# Patient Record
Sex: Male | Born: 1970 | Hispanic: Yes | Marital: Single | Smoking: Never smoker
Health system: Southern US, Community
[De-identification: ages and names within clinical notes are randomized; demographics above are authoritative.]

---

## 2013-12-22 ENCOUNTER — Emergency Department (HOSPITAL_COMMUNITY): Payer: BC Managed Care – PPO

## 2013-12-22 ENCOUNTER — Emergency Department (HOSPITAL_COMMUNITY)
Admission: EM | Admit: 2013-12-22 | Discharge: 2013-12-22 | Disposition: A | Discharge status: Home / Self Care | Payer: BC Managed Care – PPO | Attending: Emergency Medicine | Admitting: Emergency Medicine

## 2013-12-22 ENCOUNTER — Encounter (HOSPITAL_COMMUNITY): Payer: Self-pay | Admitting: Emergency Medicine

## 2013-12-22 DIAGNOSIS — S63289A Dislocation of proximal interphalangeal joint of unspecified finger, initial encounter: Secondary | ICD-10-CM

## 2013-12-22 DIAGNOSIS — W010XXA Fall on same level from slipping, tripping and stumbling without subsequent striking against object, initial encounter: Secondary | ICD-10-CM | POA: Insufficient documentation

## 2013-12-22 DIAGNOSIS — Z23 Encounter for immunization: Secondary | ICD-10-CM | POA: Insufficient documentation

## 2013-12-22 DIAGNOSIS — Y929 Unspecified place or not applicable: Secondary | ICD-10-CM | POA: Insufficient documentation

## 2013-12-22 DIAGNOSIS — S63279A Dislocation of unspecified interphalangeal joint of unspecified finger, initial encounter: Secondary | ICD-10-CM | POA: Insufficient documentation

## 2013-12-22 DIAGNOSIS — Y9389 Activity, other specified: Secondary | ICD-10-CM | POA: Insufficient documentation

## 2013-12-22 MED ORDER — TETANUS-DIPHTH-ACELL PERTUSSIS 5-2.5-18.5 LF-MCG/0.5 IM SUSP
0.5000 mL | Freq: Once | INTRAMUSCULAR | Status: AC
  Administered 2013-12-22: 0.5 mL via INTRAMUSCULAR
  Filled 2013-12-22: qty 0.5

## 2013-12-22 MED ORDER — IBUPROFEN 400 MG PO TABS
800.0000 mg | ORAL_TABLET | Freq: Once | ORAL | Status: AC
  Administered 2013-12-22: 800 mg via ORAL
  Filled 2013-12-22: qty 4

## 2013-12-22 NOTE — ED Notes (Signed)
Pt. presents with deformity at right index finger injured this evening , pt. fell  / no LOC /ambulatory.

## 2013-12-22 NOTE — ED Provider Notes (Signed)
CSN: [id number]     Arrival date & time 12/22/13  1947 History   None    This chart was scribed for non-physician practitioner, Coral Ceo, PA-C working with Gwyneth Sprout, MD by Arlan Organ, ED Scribe. This patient was seen in room TR09C/TR09C and the patient's care was started at 8:44 PM.   Chief Complaint  Patient presents with  . Finger Injury   The history is provided by the patient. No language interpreter was used.    HPI Comments: Eric Moore is a 43 y.o. male with no PMH who presents to the Emergency Department complaining of a R index finger injury that started just prior to arrival. Pt states he slipped and fell while in a [place] and feels he may have dislocated his finger. No head trauma or LOC. He now reports constant, moderate pain to R index finger that is unchanged. He has not tried anything OTC or any home remedies for his discomfort. At this time he denies an fever, chills, numbness, paresthesia, or loss of sensation. He has no other pertinent past medical history. No other concerns this visit. Has a small abrasion to the finger. Tetanus not up to date.    History reviewed. No pertinent past medical history. History reviewed. No pertinent past surgical history. No family history on file. History  Substance Use Topics  . Smoking status: Never Smoker   . Smokeless tobacco: Not on file  . Alcohol Use: No    Review of Systems  Gastrointestinal: Negative for nausea and vomiting.  Musculoskeletal: Positive for arthralgias (Deformity to R index finger) and joint swelling. Negative for back pain, gait problem, myalgias and neck pain.  Skin: Positive for wound. Negative for rash.  Neurological: Negative for weakness and numbness.    Allergies  Review of patient's allergies indicates no known allergies.  Home Medications   Prior to Admission medications   Not on File   Triage Vitals: BP 110/67  Pulse 67  Temp(Src) 98.5 F (36.9 C) (Oral)  Resp 14  Ht  5\' 9"  (1.753 m)  Wt 183 lb (83.008 kg)  BMI 27.01 kg/m2  SpO2 99%   Filed Vitals:   12/22/13 2022  BP: 110/67  Pulse: 67  Temp: 98.5 F (36.9 C)  TempSrc: Oral  Resp: 14  Height: 5\' 9"  (1.753 m)  Weight: 183 lb (83.008 kg)  SpO2: 99%    Physical Exam  Nursing note and vitals reviewed. Constitutional: He is oriented to person, place, and time. He appears well-developed and well-nourished. No distress.  HENT:  Head: Normocephalic and atraumatic.  Right Ear: External ear normal.  Left Ear: External ear normal.  Mouth/Throat: Oropharynx is clear and moist.  Eyes: Conjunctivae are normal. Right eye exhibits no discharge. Left eye exhibits no discharge.  Neck: Normal range of motion.  Cardiovascular: Normal rate.   Radial pulses present and equal bilaterally. Capillary refill <2 seconds in right 2nd digit  Pulmonary/Chest: Effort normal.  Abdominal: He exhibits no distension.  Musculoskeletal: Normal range of motion. He exhibits edema and tenderness.       Hands: Obvious deformity to the right 2nd digit at the PIP joint with surrounding edema. Middle and distal phalanx are displaced posteriorly at the PIP joint.Tenderness to palpation to the right 2nd digit throughout. No other tenderness to palpation to the right hand or wrist.   Neurological: He is alert and oriented to person, place, and time.  Sensation intact in the right 2nd digit  Skin: Skin is warm  and dry. He is not diaphoretic.  3 mm circular abrasion to the middle phalanx of the right 2nd digit. Bleeding controlled.   Psychiatric: He has a normal mood and affect.    ED Course  Reduction of dislocation Date/Time: 12/22/2013 9:00 PM Performed by: Coral CeoPALMER, Aries Kasa K Authorized by: Jillyn LedgerPALMER, Keyasha Miah K Consent: Verbal consent obtained. Risks and benefits: risks, benefits and alternatives were discussed Consent given by: patient Patient identity confirmed: verbally with patient Preparation: Patient was prepped and draped  in the usual sterile fashion. Local anesthesia used: yes Anesthesia: digital block Local anesthetic: lidocaine 2% without epinephrine Anesthetic total: 4 ml Patient sedated: no Patient tolerance: Patient tolerated the procedure well with no immediate complications. Comments: Patient able to flex and extend PIP, DIP and MCP joints of right 2nd digit post-reduction   (including critical care time)  DIAGNOSTIC STUDIES: Oxygen Saturation is 99% on RA, Normal by my interpretation.    COORDINATION OF CARE: 8:48 PM- Will give Boostrix and Ibuprofen. Will order Boostrix and DG Finger Index R. Discussed treatment plan with pt at bedside and pt agreed to plan.     Labs Review Labs Reviewed - No data to display  Imaging Review Dg Finger Index Right  12/22/2013   CLINICAL DATA:  Postreduction, right index finger PIP joint dislocation.  EXAM: RIGHT INDEX FINGER 2+V  COMPARISON:  12/22/2013 at 2123 hr  FINDINGS: PIP joint dislocation has been successfully reduced. No fracture. There is surrounding soft tissue swelling.  IMPRESSION: Successful reduction of the right index finger PIP joint dislocation.   Electronically Signed   By: Amie Portlandavid  Ormond M.D.   On: 12/22/2013 22:59   Dg Finger Index Right  12/22/2013   CLINICAL DATA:  Post fall, now with pain involving the index finger  EXAM: RIGHT INDEX FINGER 2+V  COMPARISON:  None  FINDINGS: The PIP joint of the index finger is dislocated with the base of the middle phalanx perched upon the dorsal lateral cortex of the distal epiphysis of the proximal phalanx. This finding is without associated definitive displaced fracture. Expected adjacent soft tissue swelling. No radiopaque foreign body.  IMPRESSION: Dislocated PIP joint of the index finger without associated fracture or radiopaque foreign body.   Electronically Signed   By: Simonne ComeJohn  Watts M.D.   On: 12/22/2013 21:36     EKG Interpretation None      MDM   Eric Moore is a 43 y.o. male with no PMH  who presents to the Emergency Department complaining of a R index finger injury that started just prior to arrival. Patient found to have a PIP dislocation, which was successfully reduced in the ED. No fx. Patient neurovascularly intact. Small abrasion, which did not require repair. Tetanus booster given in ED. Finger splinted. Hand referral. RICE method discussed. Return precautions, discharge instructions, and follow-up was discussed with the patient before discharge.     There are no discharge medications for this patient.    Final impressions: 1. Dislocation of finger PIP joint      Luiz IronJessica Katlin Sibley Rolison PA-C   This patient was discussed with Dr. Anitra LauthPlunkett    I personally performed the services described in this documentation, which was scribed in my presence. The recorded information has been reviewed and is accurate.    Jillyn LedgerJessica K Abella Shugart, PA-C 12/24/13 [phone]

## 2013-12-22 NOTE — Discharge Instructions (Signed)
Take Ibuprofen for pain  Keep hand elevated - see RICE method  Return to the emergency department if you develop any changing/worsening condition, or any other concerns (please read additional information regarding your condition below)    Finger Dislocation Finger dislocation is the displacement of bones in your finger at the joints. Most commonly, finger dislocation occurs at the proximal interphalangeal joint (the joint closest to your knuckle). Very strong, fibrous tissues (ligaments) and joint capsules connect the three bones of your fingers.  CAUSES Dislocation is caused by a forceful impact. This impact moves these bones off the joint and often tears your ligaments.  SYMPTOMS Symptoms of finger dislocation include:  Deformity of your finger.  Pain, with loss of movement. DIAGNOSIS  Finger dislocation is diagnosed with a physical exam. Often, X-ray exams are done to see if you have associated injuries, such as bone fractures. TREATMENT  Finger dislocations are treated by putting your bones back into position (reduction) either by manually moving the bones back into place or through surgery. Your finger is then kept in a fixed position (immobilized) with the use of a dressing or splint for a brief period. When your ligament has to be surgically repaired, it needs to be kept in a fixed position with a dressing or splint for 1 to 2 weeks. Because joint stiffness is a long-term complication of finger dislocation, hand exercises or physical therapy to increase the range of motion and to regain strength is usually started as soon as the ligament is healed. Exercises and therapy generally last no more than 3 months. HOME CARE INSTRUCTIONS The following measures can help to reduce pain and speed up the healing process:  Rest your injured joint. Do not move until instructed otherwise by your caregiver. Avoid activities similar to the one that caused your injury.  Apply ice to your injured  joint for the first day or 2 after your reduction or as directed by your caregiver. Applying ice helps to reduce inflammation and pain.  Put ice in a plastic bag.  Place a towel between your skin and the bag.  Leave the ice on for 15-20 minutes at a time, every 2 hours while you are awake.  Elevate your hand above your heart as directed by your caregiver to reduce swelling.  Take over-the-counter or prescription medicine for pain as your caregiver instructs you. SEEK IMMEDIATE MEDICAL CARE IF:  Your dressing or splint becomes damaged.  Your pain becomes worse rather than better.  You lose feeling in your finger, or it becomes cold and white. MAKE SURE YOU:  Understand these instructions.  Will watch your condition.  Will get help right away if you are not doing well or get worse. Document Released: 07/04/2000 Document Revised: 09/29/2011 Document Reviewed: 04/27/2011 [facility] Patient Information 2014 [place], [place].  RICE: Routine Care for Injuries The routine care of many injuries includes Rest, Ice, Compression, and Elevation (RICE). HOME CARE INSTRUCTIONS  Rest is needed to allow your body to heal. Routine activities can usually be resumed when comfortable. Injured tendons and bones can take up to 6 weeks to heal. Tendons are the cord-like structures that attach muscle to bone.  Ice following an injury helps keep the swelling down and reduces pain.  Put ice in a plastic bag.  Place a towel between your skin and the bag.  Leave the ice on for 15-20 minutes, 03-04 times a day. Do this while awake, for the first 24 to 48 hours. After that, continue as  directed by your caregiver.  Compression helps keep swelling down. It also gives support and helps with discomfort. If an elastic bandage has been applied, it should be removed and reapplied every 3 to 4 hours. It should not be applied tightly, but firmly enough to keep swelling down. Watch fingers or toes for swelling, bluish  discoloration, coldness, numbness, or excessive pain. If any of these problems occur, remove the bandage and reapply loosely. Contact your caregiver if these problems continue.  Elevation helps reduce swelling and decreases pain. With extremities, such as the arms, hands, legs, and feet, the injured area should be placed near or above the level of the heart, if possible. SEEK IMMEDIATE MEDICAL CARE IF:  You have persistent pain and swelling.  You develop redness, numbness, or unexpected weakness.  Your symptoms are getting worse rather than improving after several days. These symptoms may indicate that further evaluation or further X-rays are needed. Sometimes, X-rays may not show a small broken bone (fracture) until 1 week or 10 days later. Make a follow-up appointment with your caregiver. Ask when your X-ray results will be ready. Make sure you get your X-ray results. Document Released: 10/19/2000 Document Revised: 09/29/2011 Document Reviewed: 12/06/2010 [facility] Patient Information 2014 [place], [place].  Laceration Care, Adult A laceration is a cut or lesion that goes through all layers of the skin and into the tissue just beneath the skin. TREATMENT  Some lacerations may not require closure. Some lacerations may not be able to be closed due to an increased risk of infection. It is important to see your caregiver as soon as possible after an injury to minimize the risk of infection and maximize the opportunity for successful closure. If closure is appropriate, pain medicines may be given, if needed. The wound will be cleaned to help prevent infection. Your caregiver will use stitches (sutures), staples, wound glue (adhesive), or skin adhesive strips to repair the laceration. These tools bring the skin edges together to allow for faster healing and a better cosmetic outcome. However, all wounds will heal with a scar. Once the wound has healed, scarring can be minimized by covering the wound with  sunscreen during the day for 1 full year. HOME CARE INSTRUCTIONS  For sutures or staples: Keep the wound clean and dry. If you were given a bandage (dressing), you should change it at least once a day. Also, change the dressing if it becomes wet or dirty, or as directed by your caregiver. Wash the wound with soap and water 2 times a day. Rinse the wound off with water to remove all soap. Pat the wound dry with a clean towel. After cleaning, apply a thin layer of the antibiotic ointment as recommended by your caregiver. This will help prevent infection and keep the dressing from sticking. You may shower as usual after the first 24 hours. Do not soak the wound in water until the sutures are removed. Only take over-the-counter or prescription medicines for pain, discomfort, or fever as directed by your caregiver. Get your sutures or staples removed as directed by your caregiver. For skin adhesive strips: Keep the wound clean and dry. Do not get the skin adhesive strips wet. You may bathe carefully, using caution to keep the wound dry. If the wound gets wet, pat it dry with a clean towel. Skin adhesive strips will fall off on their own. You may trim the strips as the wound heals. Do not remove skin adhesive strips that are still stuck to the wound.  They will fall off in time. For wound adhesive: You may briefly wet your wound in the shower or bath. Do not soak or scrub the wound. Do not swim. Avoid periods of heavy perspiration until the skin adhesive has fallen off on its own. After showering or bathing, gently pat the wound dry with a clean towel. Do not apply liquid medicine, cream medicine, or ointment medicine to your wound while the skin adhesive is in place. This may loosen the film before your wound is healed. If a dressing is placed over the wound, be careful not to apply tape directly over the skin adhesive. This may cause the adhesive to be pulled off before the wound is healed. Avoid  prolonged exposure to sunlight or tanning lamps while the skin adhesive is in place. Exposure to ultraviolet light in the first year will darken the scar. The skin adhesive will usually remain in place for 5 to 10 days, then naturally fall off the skin. Do not pick at the adhesive film. You may need a tetanus shot if: You cannot remember when you had your last tetanus shot. You have never had a tetanus shot. If you get a tetanus shot, your arm may swell, get red, and feel warm to the touch. This is common and not a problem. If you need a tetanus shot and you choose not to have one, there is a rare chance of getting tetanus. Sickness from tetanus can be serious. SEEK MEDICAL CARE IF:  You have redness, swelling, or increasing pain in the wound. You see a red line that goes away from the wound. You have yellowish-white fluid (pus) coming from the wound. You have a fever. You notice a bad smell coming from the wound or dressing. Your wound breaks open before or after sutures have been removed. You notice something coming out of the wound such as wood or glass. Your wound is on your hand or foot and you cannot move a finger or toe. SEEK IMMEDIATE MEDICAL CARE IF:  Your pain is not controlled with prescribed medicine. You have severe swelling around the wound causing pain and numbness or a change in color in your arm, hand, leg, or foot. Your wound splits open and starts bleeding. You have worsening numbness, weakness, or loss of function of any joint around or beyond the wound. You develop painful lumps near the wound or on the skin anywhere on your body. MAKE SURE YOU:  Understand these instructions. Will watch your condition. Will get help right away if you are not doing well or get worse. Document Released: 07/07/2005 Document Revised: 09/29/2011 Document Reviewed: 12/31/2010 [facility] Patient Information 2014 [place].

## 2013-12-26 NOTE — ED Provider Notes (Signed)
Medical screening examination/treatment/procedure(s) were performed by non-physician practitioner and as supervising physician I was immediately available for consultation/collaboration.   EKG Interpretation None        Rowen Hur, MD 12/26/13 2111 

## 2014-11-30 IMAGING — CR DG FINGER INDEX 2+V*R*
2 series · 2 of 2 positions shown · non-contrast
Comparison: 12/22/2013 at 9697 hr

CLINICAL DATA: Postreduction, right index finger PIP joint
dislocation.

EXAM:
RIGHT INDEX FINGER 2+V

[x finger pa right]
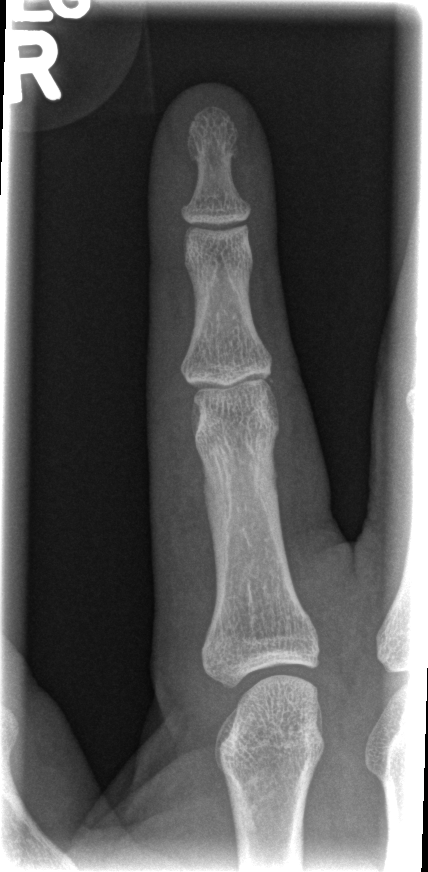

[x finger lateral right]
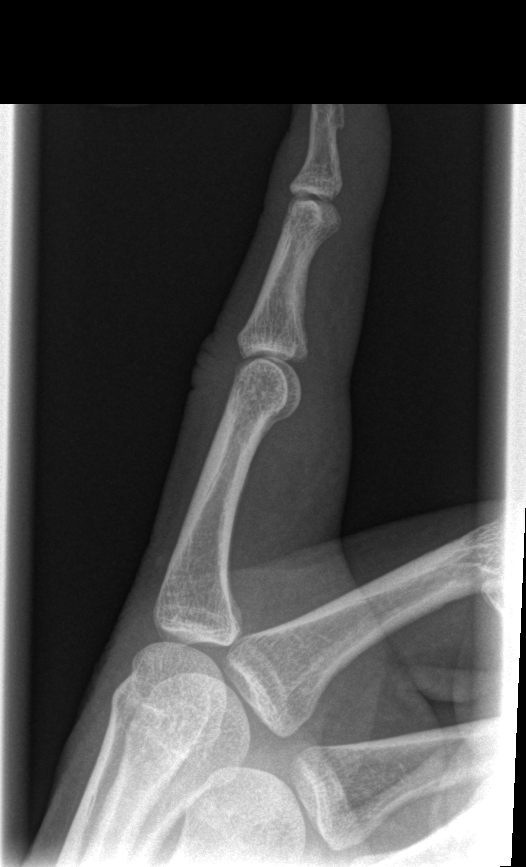

[2 of 2 positions shown; findings below may reference images not displayed]

FINDINGS: PIP joint dislocation has been successfully reduced. No fracture.
There is surrounding soft tissue swelling.
IMPRESSION: Successful reduction of the right index finger PIP joint
dislocation.

## 2014-11-30 IMAGING — CR DG FINGER INDEX 2+V*R*
3 series · 3 of 3 positions shown · non-contrast
Comparison: None

CLINICAL DATA: Post fall, now with pain involving the index finger

EXAM:
RIGHT INDEX FINGER 2+V

[x finger pa right]
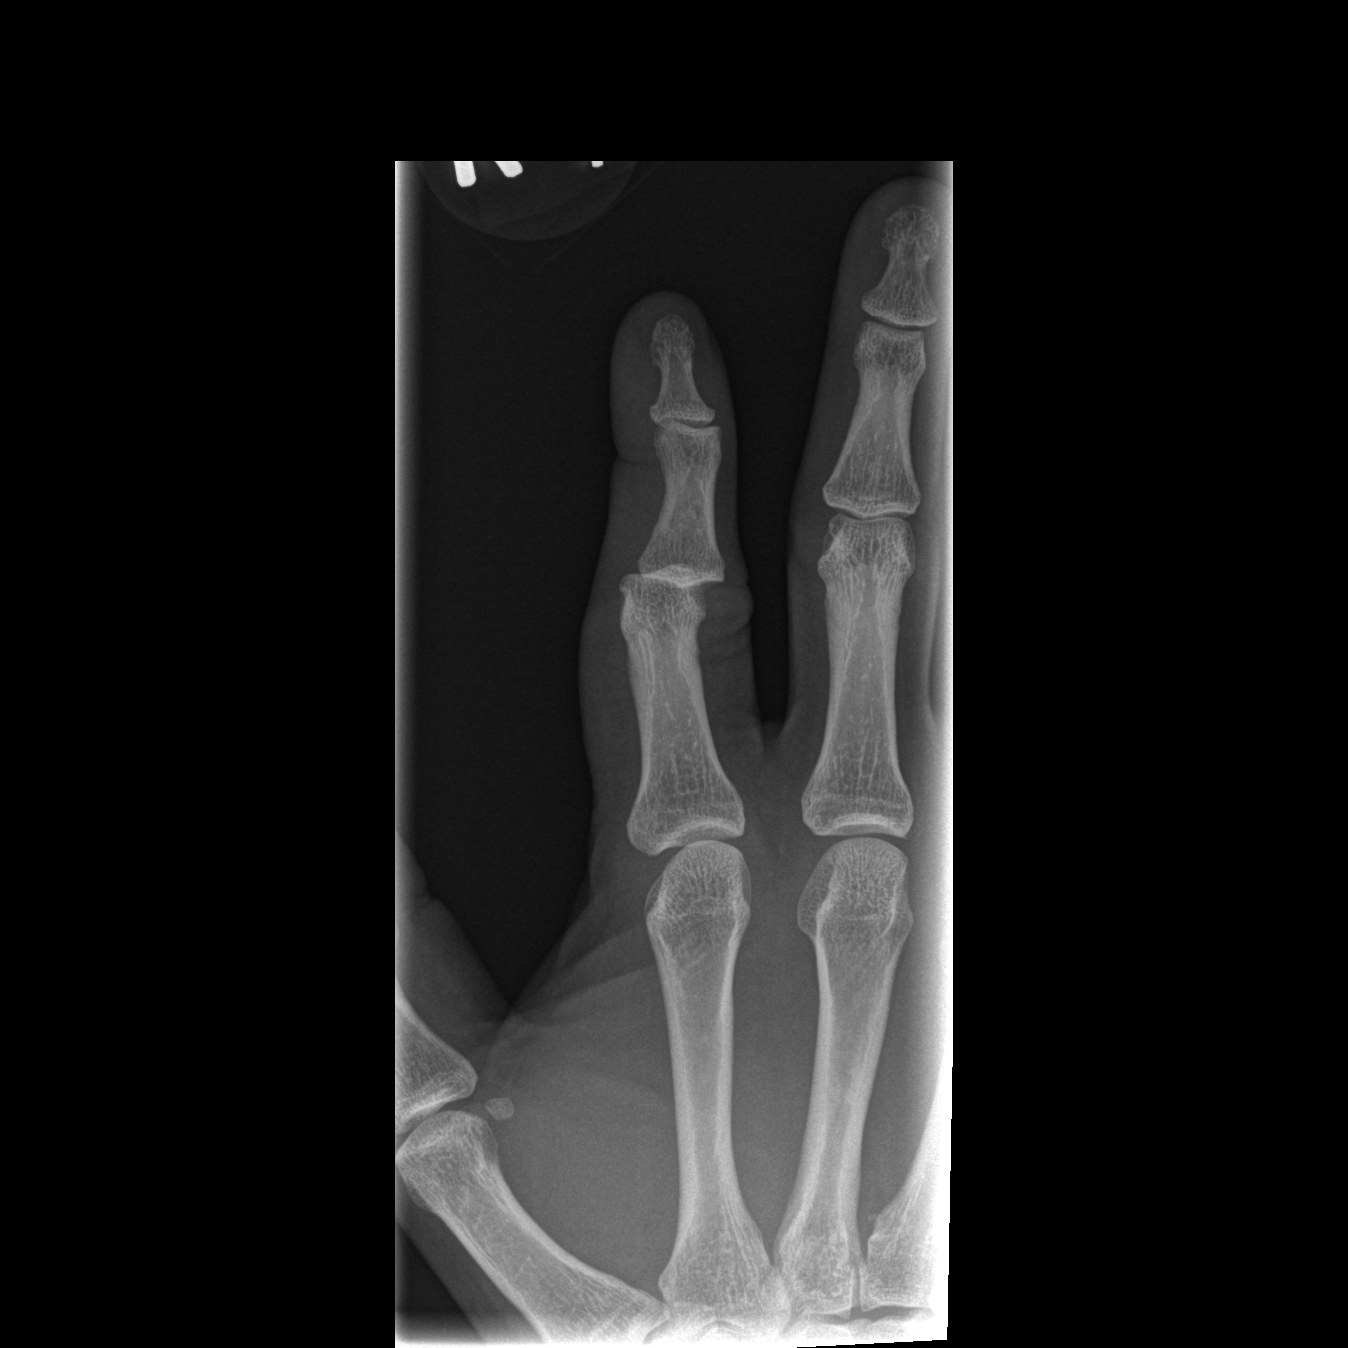

[x finger obl. right]
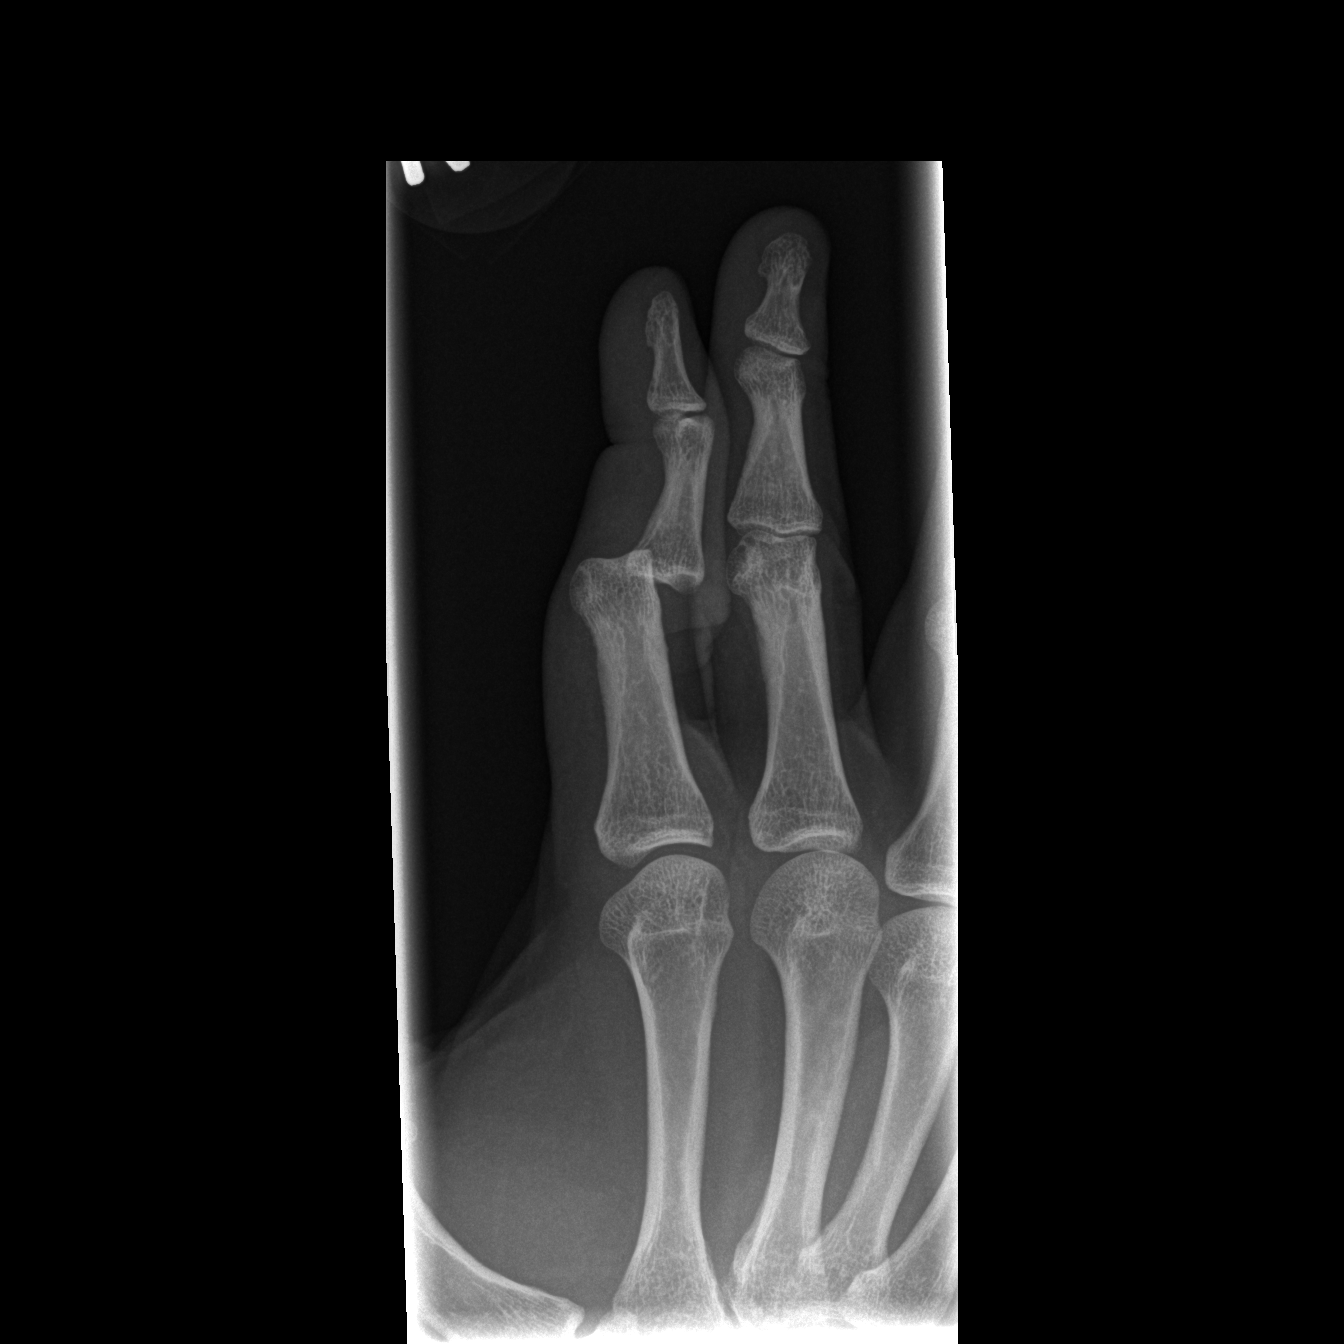

[x finger lateral right]
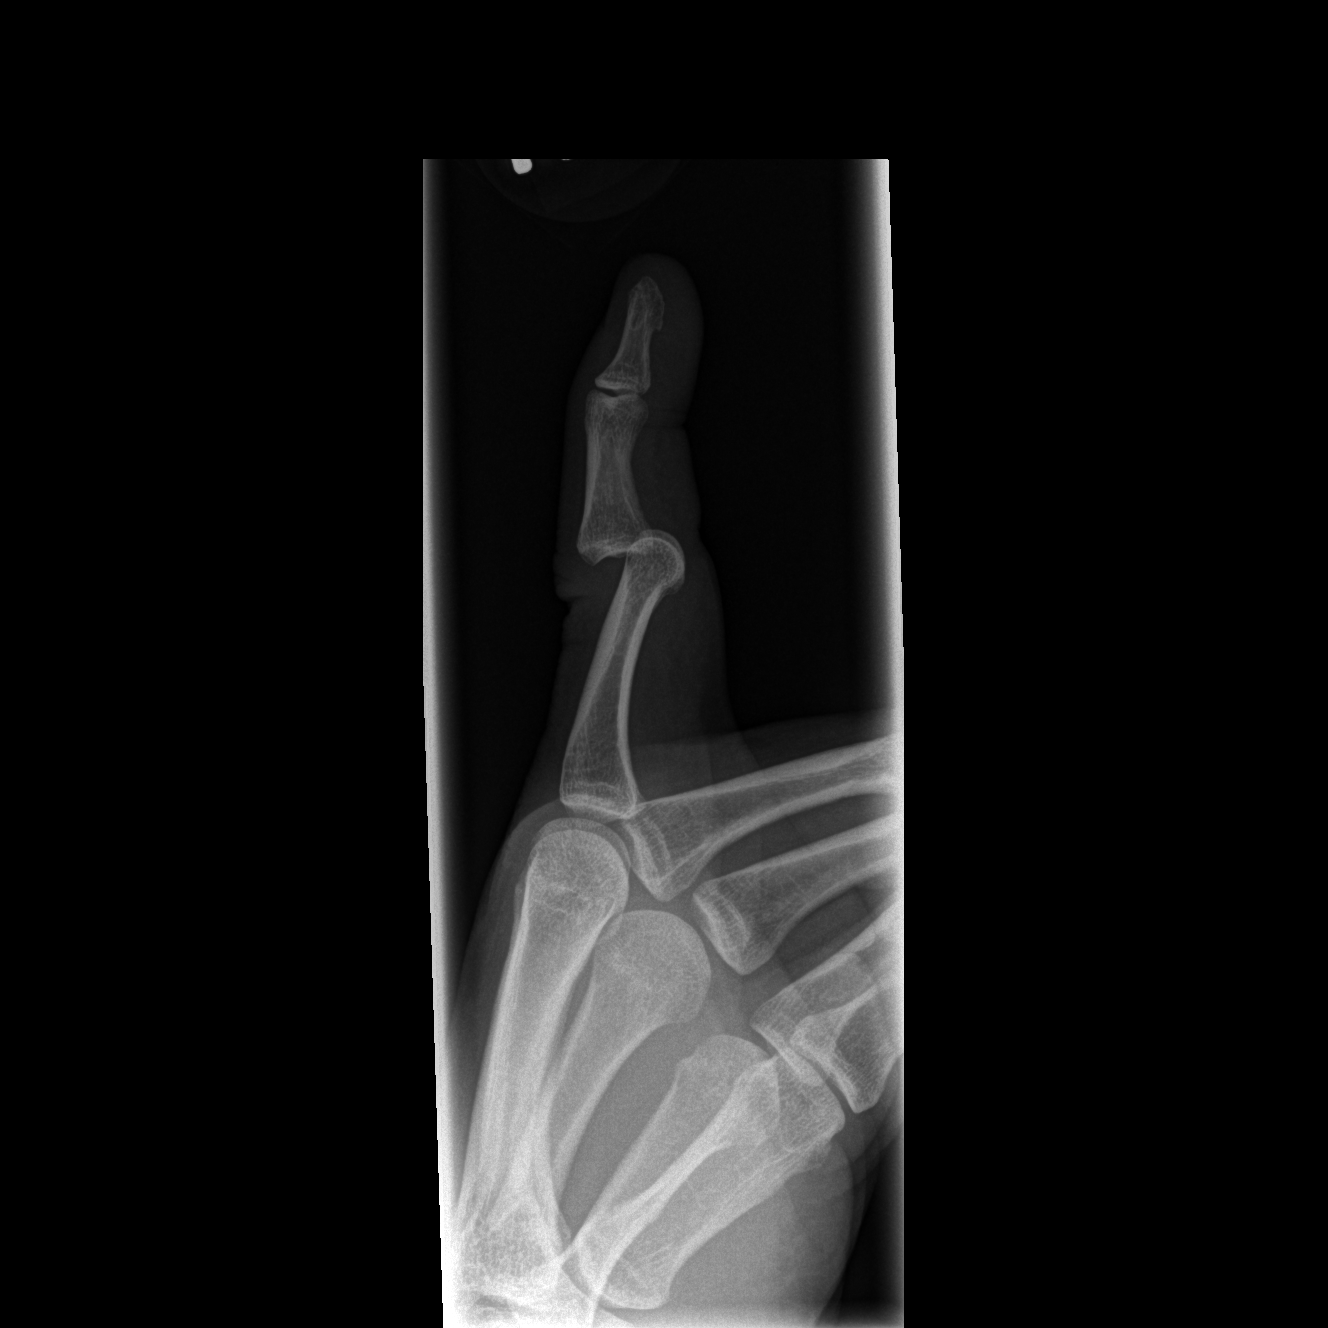

[3 of 3 positions shown; findings below may reference images not displayed]

FINDINGS: The PIP joint of the index finger is dislocated with the base of the
middle phalanx perched upon the dorsal lateral cortex of the distal
epiphysis of the proximal phalanx. This finding is without
associated definitive displaced fracture. Expected adjacent soft
tissue swelling. No radiopaque foreign body.
IMPRESSION: Dislocated PIP joint of the index finger without associated fracture
or radiopaque foreign body.

## 2023-03-24 ENCOUNTER — Encounter: Payer: Self-pay | Admitting: Internal Medicine

## 2023-03-24 ENCOUNTER — Other Ambulatory Visit: Payer: Self-pay | Admitting: Internal Medicine

## 2023-03-24 DIAGNOSIS — R1110 Vomiting, unspecified: Secondary | ICD-10-CM

## 2023-04-07 ENCOUNTER — Ambulatory Visit
Admission: RE | Admit: 2023-04-07 | Discharge: 2023-04-07 | Disposition: A | Discharge status: Home / Self Care | Payer: Self-pay | Source: Ambulatory Visit | Attending: Internal Medicine | Admitting: Internal Medicine

## 2023-04-07 DIAGNOSIS — R1110 Vomiting, unspecified: Secondary | ICD-10-CM
